# Patient Record
Sex: Female | Born: 1985 | Race: Black or African American | Hispanic: No | Marital: Single | Smoking: Current every day smoker
Health system: Southern US, Community
[De-identification: ages and names within clinical notes are randomized; demographics above are authoritative.]

## PROBLEM LIST (undated history)

## (undated) HISTORY — PX: DENTAL SURGERY: SHX609

---

## 2006-04-13 ENCOUNTER — Emergency Department (HOSPITAL_COMMUNITY): Admission: EM | Admit: 2006-04-13 | Discharge: 2006-04-13 | Payer: Self-pay | Admitting: Emergency Medicine

## 2006-04-27 ENCOUNTER — Ambulatory Visit: Payer: Self-pay | Admitting: Obstetrics and Gynecology

## 2006-04-27 ENCOUNTER — Ambulatory Visit: Payer: Self-pay | Admitting: Family Medicine

## 2006-04-27 ENCOUNTER — Inpatient Hospital Stay (HOSPITAL_COMMUNITY): Admission: EM | Admit: 2006-04-27 | Discharge: 2006-04-30 | Payer: Self-pay | Admitting: Emergency Medicine

## 2006-05-05 ENCOUNTER — Ambulatory Visit: Payer: Self-pay | Admitting: Obstetrics and Gynecology

## 2006-05-31 ENCOUNTER — Ambulatory Visit (HOSPITAL_COMMUNITY): Admission: RE | Admit: 2006-05-31 | Discharge: 2006-05-31 | Payer: Self-pay | Admitting: Obstetrics and Gynecology

## 2008-04-10 IMAGING — CT CT PELVIS W/ CM
2 of 5 series · 16 of 46 positions shown, 18 images · IV contrast (APPLIED)
Comparison: none

CLINICAL DATA: Evaluate free fluid identified on ultrasound.   Patient has 2-week history of headaches, nausea, vomiting, fevers, dizziness.  Heavy menstrual bleeding.
ABDOMEN CT WITH CONTRAST:
TECHNIQUE: Multidetector CT imaging of the abdomen was performed following the standard protocol during bolus administration of intravenous contrast.
Contrast:  80 cc Omnipaque 300.
TECHNIQUE: Multidetector CT imaging of the pelvis was performed following the standard protocol during bolus administration of intravenous contrast.

[Series 2: abd/pelv with 5.0 b31f st · axial · 0.75mm/px · z∈[-398,+2]mm · 13 of 92 slices shown, 15 images]
[im 6/92  soft-tissue]
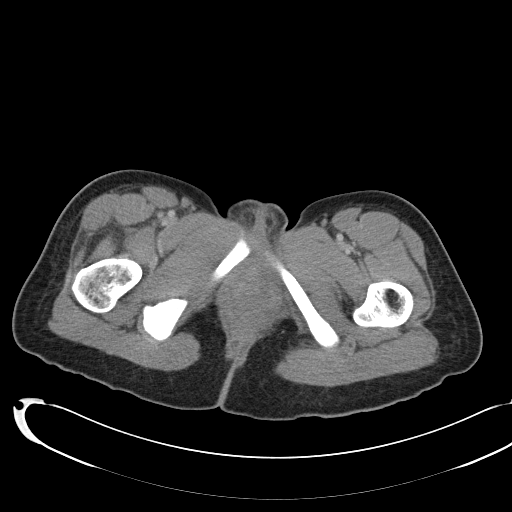
[im 6/92  bone]
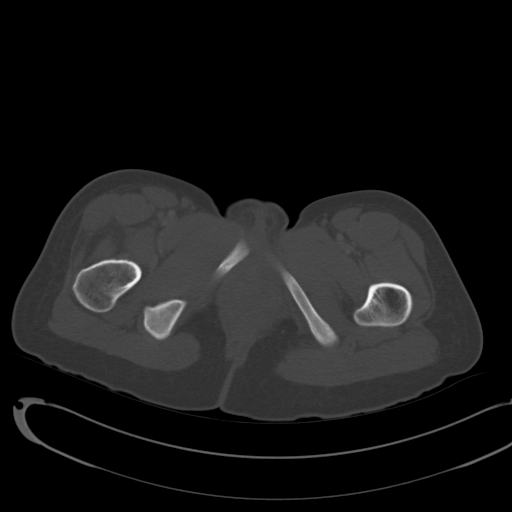
[im 11/92  soft-tissue]
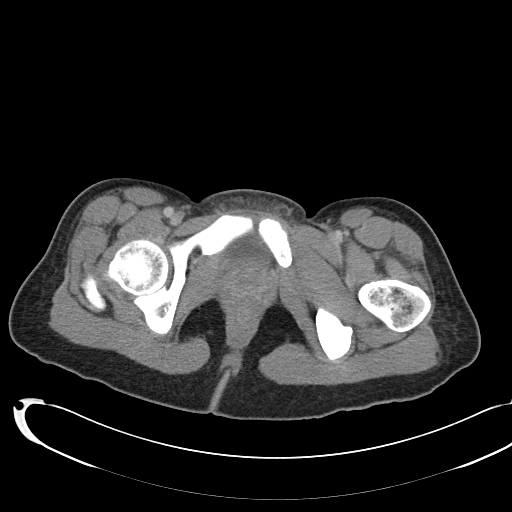
[im 21/92  soft-tissue]
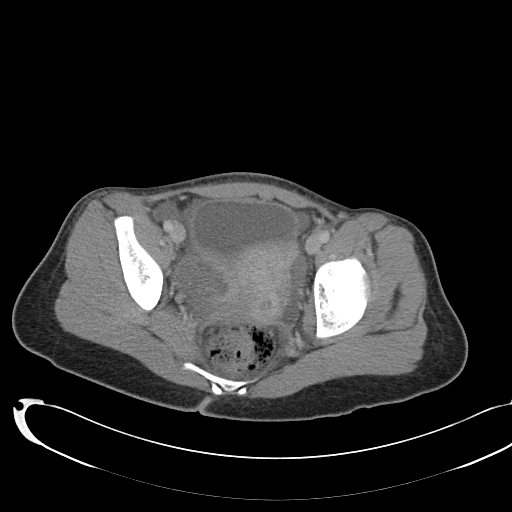
[im 26/92  soft-tissue]
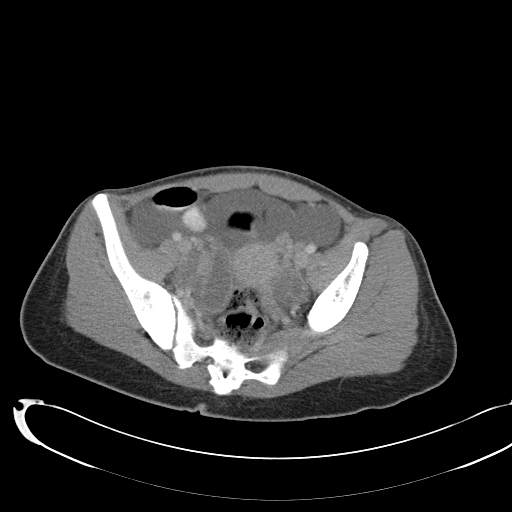
[im 31/92  soft-tissue]
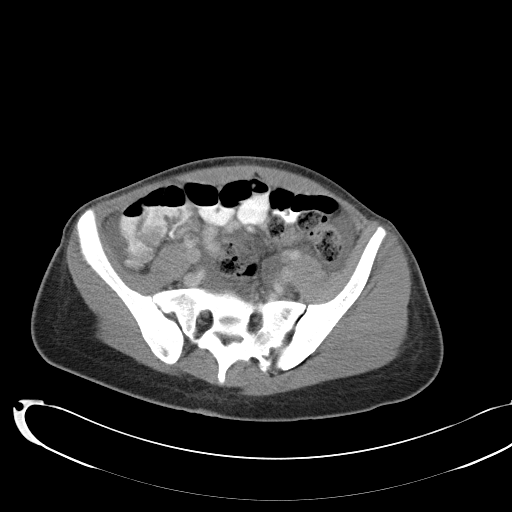
[im 41/92  soft-tissue]
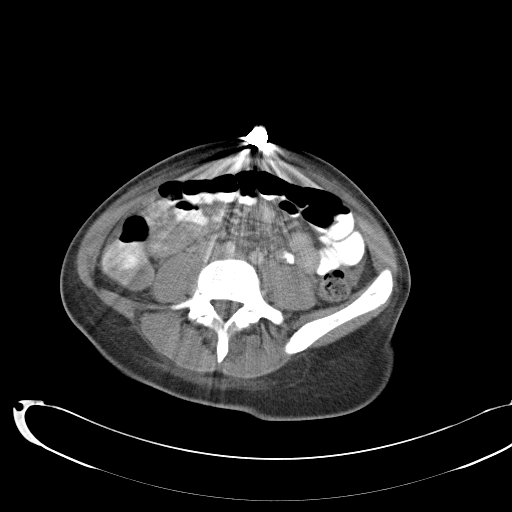
[im 46/92  soft-tissue]
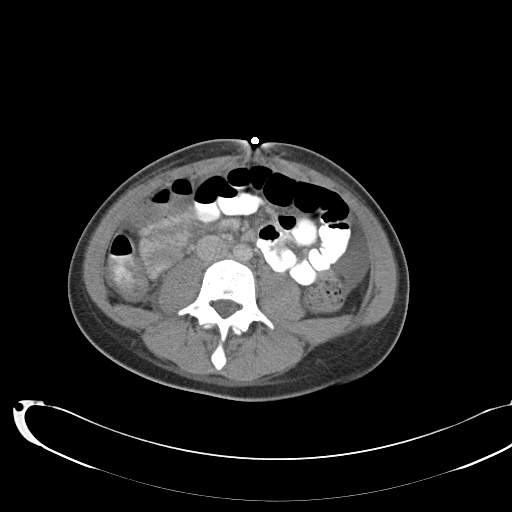
[im 51/92  soft-tissue]
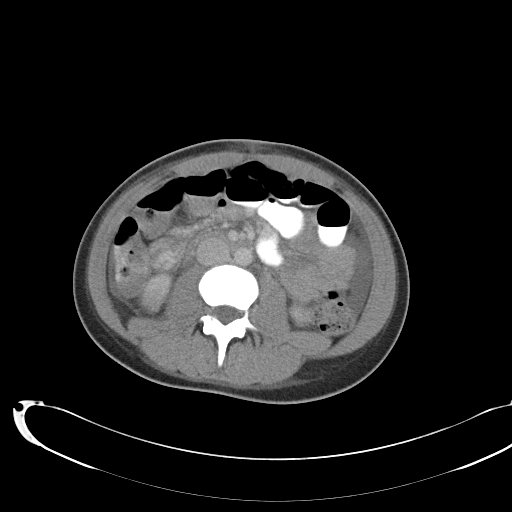
[im 61/92  soft-tissue]
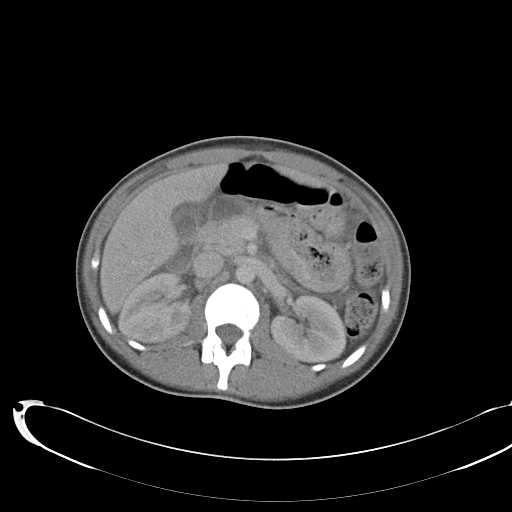
[im 61/92  bone]
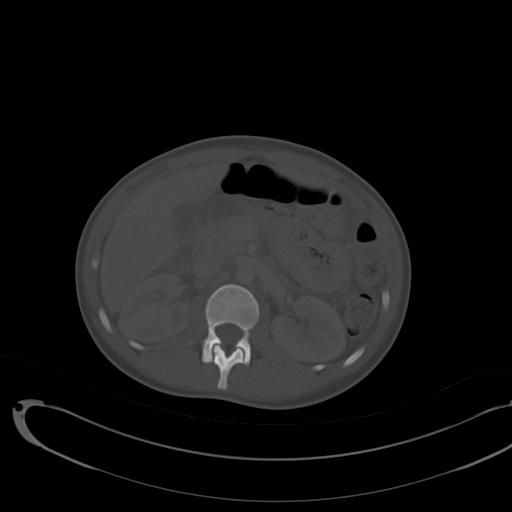
[im 66/92  soft-tissue]
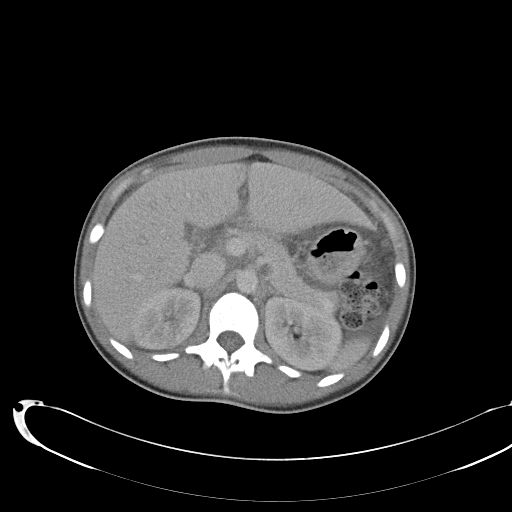
[im 71/92  soft-tissue]
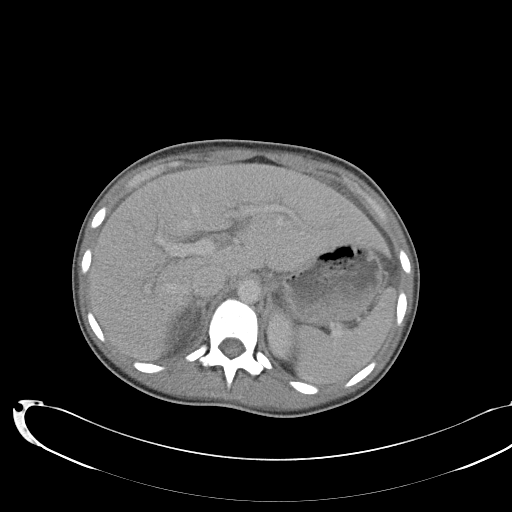
[im 81/92  soft-tissue]
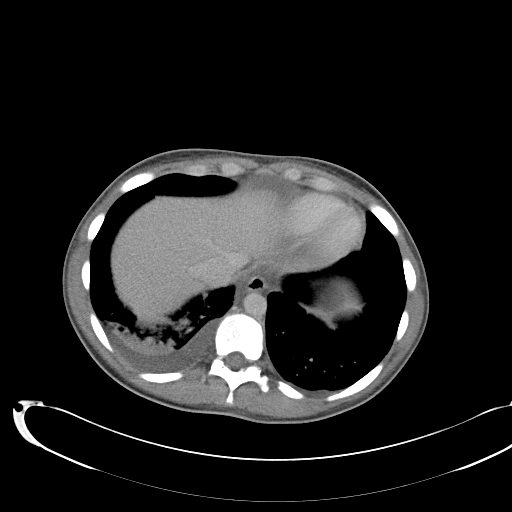
[im 86/92  soft-tissue]
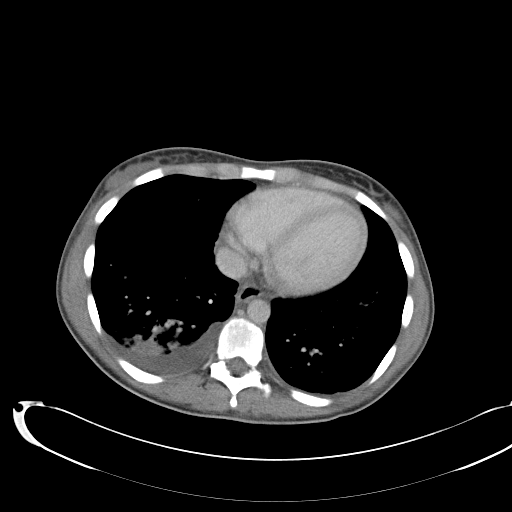

[Series 602: coronal · coronal · 0.89mm/px · 3 of 98 slices shown]
[im 33/98  soft-tissue]
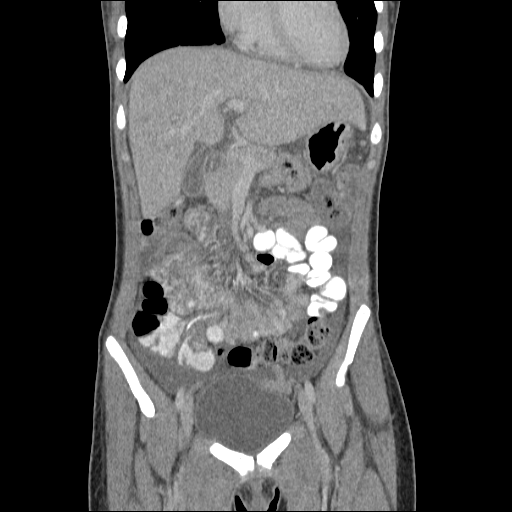
[im 44/98  soft-tissue]
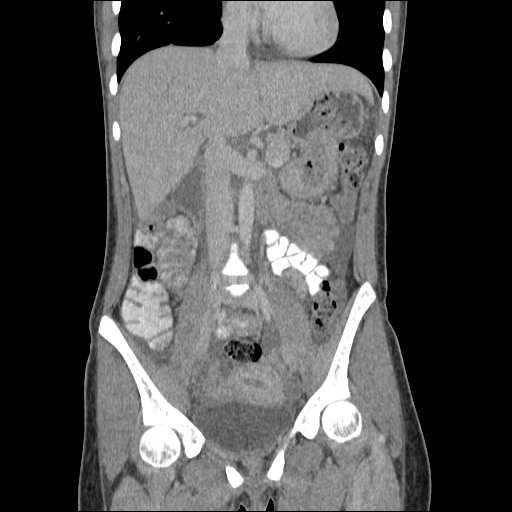
[im 54/98  soft-tissue]
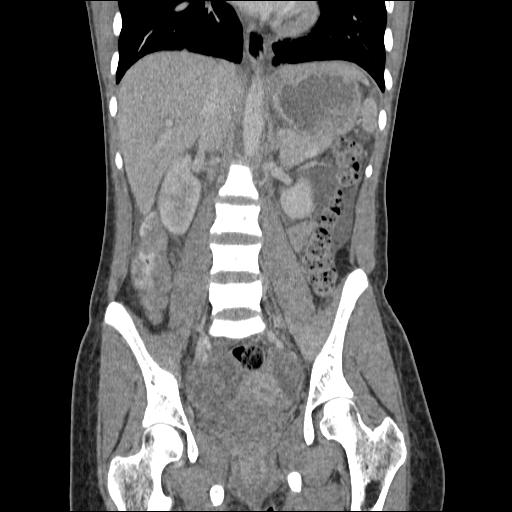

[16 of 46 positions shown; findings below may reference images not displayed]

FINDINGS: There is a right pleural effusion.  Overlying air space disease is noted.  There is mild atelectasis at the left base.  
There is mild periportal edema.  The liver is otherwise normal in attenuation and morphology.  The spleen is negative.  The adrenal gland is negative.   The pancreas is negative.  There is pericholecystic fluid.  Mild enhancement of the gallbladder wall is noted.  This is nonspecific in the setting of ascites.  
The adrenal glands are negative.  The right kidney is negative.  The left kidney is negative.
A moderate amount of free fluid is seen throughout the abdomen and pelvis.  There is no upper abdominal fluid collections identified.  The bowel loops are nonobstructive.  There is contrast material seen within the proximal right colon.
Retroperitoneal adenopathy is noted.  There is no small bowel mesenteric adenopathy.  
Review of the bone windows shows no suspicious lytic or sclerotic lesion.
IMPRESSION: Moderate amount of free fluid in abdomen and pelvis.  In the setting of fevers, nausea and vomiting, findings may be infectious or inflammatory in etiology.  Possible considerations include bowel perforation although no free air is noted.  A ruptured appendicitis may have a similar appearance.  Alternatively, the patient may have a ruptured ovarian cyst.  
PELVIS CT WITH CONTRAST:
FINDINGS: Urinary bladder is negative.  There is fluid within the central uterine canal.  In the right adnexa, there is a tubular fluid density mass.  There is peripheral enhancement.  A moderate amount of free fluid is seen within the pelvis.
IMPRESSION: 1.  Fluid density mass, which assumes a tubular configuration identified in the right adnexa.  The differential diagnosis includes tuboovarian abscess, ruptured ovarian cyst or inflamed appendix, possibly ruptured.  An ectopic pregnancy is less favored in the setting of a negative urine pregnancy test.

## 2010-10-03 NOTE — Discharge Summary (Signed)
Amber [name]              ACCOUNT NO.:  [id number]   MEDICAL RECORD NO.:  [id number]          PATIENT TYPE:  INP   LOCATION:  6705                         FACILITY:  [facility]   PHYSICIAN:  Leighton Roach McDiarmid, M.D.DATE OF BIRTH:  [birth year]   DATE OF ADMISSION:  04/27/2006  DATE OF DISCHARGE:  04/30/2006                               DISCHARGE SUMMARY   CONSULTATIONS:  1. Lesly Dukes, M.D.  2. Phil D. Okey Dupre, M.D. of gynecology.   REASON FOR ADMISSION:  Evaluation and treatment of fevers, nausea, and  chills x2 weeks.   HISTORY OF PRESENT ILLNESS:  In brief, Amber [name] is a 26 year old  female with a history of dysfunction uterine bleeding who presented with  a 2-week history of headache, nausea and vomiting, and fevers with a T-  max of 104.  She also had been bleeding heavily for the past 3 weeks.   DISCHARGE DIAGNOSES:  1. Urinary tract infection.  2. Pneumonia.  3. Severe anemia.  4. Dysfunction uterine bleeding.  5. Bacterial vaginosis.  6. Possible tubo-ovarian abscess.   DISCHARGE MEDICATIONS:  1. Ferrous sulfate 325 mg, one tab daily.  2. Ortho-Novum 1/35, one tab p.o. daily x4 days.  3. Levaquin 750 mg, one tab p.o. daily x5 days.   DISCHARGE LABORATORY:  White count of 13.5, hemoglobin of 10.4,  hematocrit of 32, platelets 295.  GC/Chlamydia negative.  A UA showed E.  coli sensitive to Levaquin.  Rapid strep positive.  TSH 1.083.  PTT 36,  PT of 15.2, INR of 1.2.  Blood cultures negative state.  Ferritin 11.   PENDING LABORATORY:  HIV, RPR, and von Willebrand's.   HOSPITAL COURSE:  1. Infectious disease.  The patient presented with a temperature in      the emergency department of 102.2 with a pulse of 142 with a blood      pressure of 99/69, and a heart rate of 20.  She was sating 100% on      room air.  Furthermore her hemoglobin in the emergency department      was 3.1.  The patient was in shock, therefore, and we immediately      admitted her  and began an immediate blood transfusion and IV fluid      hydration.  We also got a chest x-ray which was negative, a UA,      urine culture, blood culture, and GC/Chlamydia to rule out any      other possible source of infection.  Her urine grew out E. coli      which was sensitive to Levaquin.  Her blood cultures were negative.      Her GC/Chlamydia were also negative.  She also had no adnexal      tenderness or cervical motion tenderness on pelvic exam.  We also      got a vaginal ultrasound to rule out endometriosis or any retained      products or possible tubo-ovarian abscess.  Her transvaginal      ultrasound showed a large amount of free fluid.  It also showed a  dominant cyst in the right ovary.  We therefore got a CT of the      abdomen and pelvis to further evaluate the free fluid in the      abdomen which showed a free fluid density mass which assumed a      tubular configuration in the right adnexa.  We could not rule out      tubo-ovarian abscess or ruptured ovarian cyst or an inflamed      appendix.  It was believed an ectopic pregnancy was negative since      her HCG was negative.  Her HCG quant was also less than 2.  We      therefore consulted gynecology who were on board throughout her      admission.  We went ahead and began treating her as if she did have      a possible tubo-ovarian abscess, pneumonia, and UTI.  We covered      her with broad spectrum gentamicin and clindamycin.  The patient      immediately started to feel better, although on the third day of      her admission, she had a fever spike overnight into the 101.2      range.  She also started to complain about belly pain at that time      which she previously did not have a tender abdomen.  We performed      serial abdominal exams and that abdominal pain eventually resolved.      We will plan to send her home on Levaquin 750 mg to cover possible      pneumonia since she did have a pleural effusion  and some      atelectasis seen on her CT, her UTI, and possible tubo-ovarian      abscess.  2. Dysfunctional uterine bleeding.  She immediately received Premarin      25 mg IV in the emergency department to help stop the bleeding.  We      also started her on Ortho-Novum 1/35 b.i.d. x7 days.  At discharge      she has 4 days remaining on this course.  She also received 4 units      of packed red blood cells and one infusion of Venofer.  We will      send her home with an iron supplement.  On the day of her discharge      she had no complaints.  Her bleeding had stopped, and her      hemoglobin had increased to 10.4.  The rest of her vitals were also      stable at discharge.  Her blood pressure being 89 to 118 over 64 to      77, a respiratory rate of 20, and pulse of 80 to 89 and her      temperature T-max of 99.3.  3. Bacterial vaginosis.  This is incidentally found during the      patient's pelvic exam.  We started treating her with Flagyl.  Once      we changed over to broad spectrum antibiotics, she was also      adequately covered for bacterial vaginosis.  We will not continue      to treat her at this time.  4. Severe anemia secondary to dysfunctional uterine bleeding and      menometrorrhagia.  As previously stated, we will be treating her      with Ortho-Novum for a 7-day  course.  She will also follow up with      the gynecology clinic on May 05, 2006 at 1:45 p.m.     ______________________________  Amber [name], M.D.    ______________________________  Leighton Roach McDiarmid, M.D.    TA/MEDQ  D:  04/30/2006  T:  04/30/2006  Job:  [id number]

## 2010-10-03 NOTE — Group Therapy Note (Signed)
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 Also at  that time, had a severe pyelonephritis, which was treated and placed on  oral contraceptives for dysfunctional bleeding control. She is  completely asymptomatic now. She had a CT that showed a possible pelvic  abscess.   On examination now, the abdomen was soft, flat and nontender. No masses.  No organomegaly.   I am going to have her return in a month, stay on the iron and will  repeat her H&H when she comes in at that time. She is to repeat her  ultrasound in a month and we will get a followup on that and she is to  stay on oral contraceptives.           ______________________________  Argentina Donovan, MD     PR/MEDQ  D:  05/05/2006  T:  05/05/2006  Job:  [id number]

## 2010-10-03 NOTE — H&P (Signed)
[name], [name] NO.:  [id number]   MEDICAL RECORD NO.:  [id number]          PATIENT TYPE:  INP   LOCATION:  2036                         FACILITY:  [facility]   PHYSICIAN:  Santiago Bumpers. Hensel, M.D.DATE OF BIRTH:  Jan 23, 1986   DATE OF ADMISSION:  04/27/2006  DATE OF DISCHARGE:                              HISTORY & PHYSICAL   TIME SEEN:  Three o'clock p.m.   CHIEF COMPLAINT:  Fevers, nausea, chills x2 weeks.   HPI:  [name] is a 25 year old female with no known medical  problems other than dysfunctional uterine bleeding with menometrorrhagia  who presents with a 2 week history of headache, nausea, vomiting and  subjective fevers with a T-Max of 104.  She also complains of dizziness  and heavy menstrual bleeding.  She reports that the headache is a  bilateral frontal headache and is aggravated by sitting or standing and  alleviated by lying down.  She has only vomited 4 times in this 2 week  period, but has been consistently nauseated and has not been eating or  drinking.  She endorses a 15 pound weight loss over the past 2 weeks.  She denies any dysuria, hematuria or flank pain.  She also denies any  change in her bowel habits or sick contacts.  She started menstruating  at 25 years of age and has frequently had a regular cycle, which last  approximately 7 days with heavy flow, but never like this episode.   PAST MEDICAL HISTORY:  Ovarian cyst x1.   GYN HISTORY:  She had 1 elective abortion.  No history of STDs.   PAST SURGICAL HISTORY:  None.   MEDICATIONS:  None.   ALLERGIES:  NO KNOWN DRUG ALLERGIES.   FAMILY HISTORY:  She is adoptive.  She does not know her family history.   SOCIAL HISTORY:  She lives with her boyfriend of 3 months.  She does not  work.  She denies any alcohol or drug use.  She stopped smoking  approximately 3 weeks ago.   LABS ON ADMISSION:  Her white count is 21.9, her hemoglobin is 3.1,  hematocrit is 10.4, platelets of  406, PMNs are 84%.  MCV is 61, RDW of  2.  Sodium of 131, potassium of 3.4, BUN of 10, creatinine of 0.9,  glucose of 120, protein 7.2, albumin 3.3, AST 27, ALT 13.  UA shows  leukocyte esterase, moderate positive protein of 30, hemoglobin large,  no ketones, 21-58 white blood cells, 11-28 red blood cells, few  bacteria, few squames.  Rapid strep culture positive.  Blood culture is  pending.  Urine culture is pending.  Urine pregnancy test negative.  Chest x-ray shows no acute cardiopulmonary disease.   REVIEW OF SYSTEMS:  GENERAL:  She endorses positive for fever.  Positive  for chills.  Positive for malaise.  Positive for anorexia.  HEENT:  No  ear pain.  No visual changes.  RESPIRATORY:  No cough.  No shortness of  breath.  ABDOMEN:  No tenderness.  No dysuria.  No cough.  GU:  No  dysuria.  No hematuria.  No pain with intercourse.   PHYSICAL EXAMINATION:  VITAL SIGNS:  T-Max 102.2.  Pulse of 142.  Blood  pressure 99/69.  Respiratory rate of 20.  Pulse oximetry 100% on room  air.  IN GENERAL:  Patient is pale.  She is alert.  She is oriented x3.  HEENT:  Positive erythema with no pharyngeal exudate.  No  lymphadenopathy.  RESPIRATIONS:  Clear to auscultation bilaterally.  Good effort.  CVS:  Tachycardic.  Regular rhythm.  No murmurs, rubs or gallop.  ABDOMEN:  Soft, nontender.  No fundal tenderness.  Positive bowel  sounds.  No guarding.  MUSCULOSKELETAL:  No flank tenderness.  EXTREMITIES:  Two plus pulses.  No edema.  No clubbing.  PELVIC EXAM:  Shows perfuse blood in the vagina and around cervix.  Difficult to completely visualize cervix due to the amount of blood.  No  adnexal tenderness.   ASSESSMENT/PLAN:  She is a 25 year old female with fevers, nausea,  headaches, dizziness for 2 weeks.   1. Infectious disease.  Likely secondary urinary infectious source      since urinalysis shows moderate leukocyte esterase, positive      nitrites and increased white blood cells.   Patient has also been      febrile with an increased white count.  Chest x-ray is negative.      We will admit and rule out for other possible infectious      etiologies.  Urine and blood cultures pending.  Rapid strep      positive.  We will start antibiotic coverage with Zosyn.  We will      also check GC and Chlamydia.  Also check a vaginal ultrasound to      rule out endometriosis or retained products or ovarian abscess.      Tylenol for fever.  Phenergan for nausea.  2. Anemia.  Likely secondary with dysfunction bleeding with      menometrorrhagia.  We will type and cross 4 units of packed red      blood cells, transfuse now.  Patient is clinically in shock as her      hemoglobin is 3.1, pulse of 142, blood pressure of 99/69.  We will      begin IV fluid hydration.  One liter of bolus normal saline x2 in      the ED now.  Urine pregnancy test is negative.  We will check serum      folate and receive fluid.  We also checked a hemoccult.  We will      continue to monitor her H&H.  We will Premarin 25 IV x1 now.  We      will plan to recheck her clinically in 6 hours.  If she is still      bleeding, we will give another 25 mg of Premarin IV x1 now.  We      also checked her PT and PTT.  3. Tachycardia.  Likely secondary to hypovolemia and dehydration.  We      will check orthostatics, check an EKG.  Rehydrate with IV fluids      and begin transfusing blood now.  4. Menometrorrhagia with dysfunction uterine bleeding.  Give Premarin      25 mg IV x1 now, then plan to give Jodi Mourning 1/35 one tablet p.o.      t.i.d. starting tomorrow at 8 a.m.  May also need to give another      of Premarin over night.  Patient  reports having irregular periods      for years.  We will plan a pelvic ultrasound once the bleeding      subsides.  5. Nausea and vomiting.  Likely secondary to infectious etiology and     hypervolemia from ABL.  Phenergan for nausea.  6. FEN/GI.  Regular diet as tolerated.  7.  Disposition.  We will admit to step down unit, transfuse and      monitor her vitals.     ______________________________  Ruthe Mannan, M.D.    ______________________________  Santiago Bumpers. Leveda Anna, M.D.    TA/MEDQ  D:  04/27/2006  T:  04/28/2006  Job:  [id number]

## 2015-11-05 ENCOUNTER — Encounter (HOSPITAL_COMMUNITY): Payer: Self-pay | Admitting: *Deleted

## 2015-11-05 ENCOUNTER — Emergency Department (HOSPITAL_COMMUNITY)
Admission: EM | Admit: 2015-11-05 | Discharge: 2015-11-05 | Disposition: A | Discharge status: Home / Self Care | Payer: Self-pay | Attending: Emergency Medicine | Admitting: Emergency Medicine

## 2015-11-05 DIAGNOSIS — F17210 Nicotine dependence, cigarettes, uncomplicated: Secondary | ICD-10-CM | POA: Insufficient documentation

## 2015-11-05 DIAGNOSIS — K0889 Other specified disorders of teeth and supporting structures: Secondary | ICD-10-CM | POA: Insufficient documentation

## 2015-11-05 DIAGNOSIS — F1290 Cannabis use, unspecified, uncomplicated: Secondary | ICD-10-CM | POA: Insufficient documentation

## 2015-11-05 MED ORDER — AMOXICILLIN 500 MG PO CAPS
500.0000 mg | ORAL_CAPSULE | Freq: Once | ORAL | Status: AC
  Administered 2015-11-05: 500 mg via ORAL
  Filled 2015-11-05: qty 1

## 2015-11-05 MED ORDER — IBUPROFEN 800 MG PO TABS: 800.0000 mg | ORAL_TABLET | Freq: Three times a day (TID) | ORAL | Status: AC

## 2015-11-05 MED ORDER — AMOXICILLIN 500 MG PO CAPS: 500.0000 mg | ORAL_CAPSULE | Freq: Three times a day (TID) | ORAL | Status: AC

## 2015-11-05 MED ORDER — IBUPROFEN 800 MG PO TABS
800.0000 mg | ORAL_TABLET | Freq: Once | ORAL | Status: AC
  Administered 2015-11-05: 800 mg via ORAL
  Filled 2015-11-05: qty 1

## 2015-11-05 NOTE — ED Notes (Signed)
Pt reports L upper dental pain x 2 days. Has taken one of her Father's hydrocodone without relief.  Pt reports having a permanent L upper tooth implant, her dentist is in [place].

## 2015-11-05 NOTE — ED Provider Notes (Signed)
CSN: [id number]     Arrival date & time 11/05/15  1251 History  By signing my name below, I, Soijett Blue, attest that this documentation has been prepared under the direction and in the presence of Wynetta EmeryNicole Bryden Darden, PA-C Electronically Signed: Soijett Blue, ED Scribe. 11/05/2015. 2:22 PM.   Chief Complaint  Patient presents with  . Dental Pain      The history is provided by the patient. No language interpreter was used.    Brock BadGenda Saintjean is a 30 y.o. female who presents to the Emergency Department complaining of left upper dental pain onset 2 days. Pt has a dentist in [place] and she will follow up whenever she goes back to [place]. Pt reports that she has a permanent metal plate to her left upper teeth area and she denies recent injury or trauma. She states that she is having associated symptoms of left upper gum swelling. She states that she has tried her fathers 325 mg hydrocodone with no relief for her symptoms. She denies fever, chills, any other symptoms. Pt denies allergies to any medications.    History reviewed. No pertinent past medical history. Past Surgical History  Procedure Laterality Date  . Dental surgery     No family history on file. Social History  Substance Use Topics  . Smoking status: Current Every Day Smoker    Types: Cigars  . Smokeless tobacco: None  . Alcohol Use: Yes     Comment: weekly   OB History    No data available     Review of Systems  A complete 10 system review of systems was obtained and all systems are negative except as noted in the HPI and PMH.   Allergies  Review of patient's allergies indicates no known allergies.  Home Medications   Prior to Admission medications   Not on File   BP 151/104 mmHg  Pulse 97  Temp(Src) 99.3 F (37.4 C) (Oral)  Resp 16  SpO2 100%  LMP 10/15/2015 Physical Exam  Constitutional: She is oriented to person, place, and time. She appears well-developed and well-nourished. No distress.  HENT:   Head: Normocephalic and atraumatic.  Mouth/Throat: Uvula is midline and oropharynx is clear and moist. No trismus in the jaw. No uvula swelling. No oropharyngeal exudate, posterior oropharyngeal edema, posterior oropharyngeal erythema or tonsillar abscesses.  Generally good dentition, no gingival swelling, erythema or tenderness to palpation. Patient is handling their secretions. There is no tenderness to palpation or firmness underneath tongue bilaterally. No trismus.    Eyes: Conjunctivae and EOM are normal.  Neck: Neck supple.  Cardiovascular: Normal rate.   Pulmonary/Chest: Effort normal. No stridor. No respiratory distress.  Abdominal: She exhibits no distension.  Musculoskeletal: Normal range of motion.  Lymphadenopathy:    She has no cervical adenopathy.  Neurological: She is alert and oriented to person, place, and time.  Skin: Skin is warm and dry.  Psychiatric: She has a normal mood and affect. Her behavior is normal.  Nursing note and vitals reviewed.   ED Course  Procedures (including critical care time) DIAGNOSTIC STUDIES: Oxygen Saturation is 100% on RA, nl by my interpretation.    COORDINATION OF CARE: 2:19 PM Discussed treatment plan with pt at bedside which includes referral and follow up with dentist, ibuprofen, abx Rx and pt agreed to plan.    Labs Review Labs Reviewed - No data to display  Imaging Review No results found.    EKG Interpretation None      MDM  Final diagnoses:  None   Filed Vitals:   11/05/15 1311 11/05/15 1429  BP: 151/104   Pulse: 97 92  Temp: 99.3 F (37.4 C)   TempSrc: Oral   Resp: 16   SpO2: 100% 100%    Medications  ibuprofen (ADVIL,MOTRIN) tablet 800 mg (800 mg Oral Given 11/05/15 1429)  amoxicillin (AMOXIL) capsule 500 mg (500 mg Oral Given 11/05/15 1429)    Novelle Fanfan is 30 y.o. female presenting with Dental pain. She's had a history of reconstructive surgery after a fall several years ago in [place]. No  overt signs of infection. Patient will be started on amoxicillin, unfortunately this patient did not receive any relief after taking hydrocodone, I've offered her a dental block and she is refused, will start her on ibuprofen and advise very close follow-up with her dentist.  Evaluation does not show pathology that would require ongoing emergent intervention or inpatient treatment. Pt is hemodynamically stable and mentating appropriately. Discussed findings and plan with patient/guardian, who agrees with care plan. All questions answered. Return precautions discussed and outpatient follow up given.   Discharge Medication List as of 11/05/2015  2:24 PM    START taking these medications   Details  amoxicillin (AMOXIL) 500 MG capsule Take 1 capsule (500 mg total) by mouth 3 (three) times daily., Starting 11/05/2015, Until Discontinued, Print    ibuprofen (ADVIL,MOTRIN) 800 MG tablet Take 1 tablet (800 mg total) by mouth 3 (three) times daily., Starting 11/05/2015, Until Discontinued, Print         I personally performed the services described in this documentation, which was scribed in my presence. The recorded information has been reviewed and is accurate.    Wynetta Emery, PA-C 11/05/15 1513  Vanetta Mulders, MD 11/06/15 1003

## 2015-11-05 NOTE — Discharge Instructions (Signed)
Take vicodin for breakthrough pain, do not drink alcohol, drive, care for children or do other critical tasks while taking vicodin. ° °Return to the emergency room for fever, change in vision, redness to the face that rapidly spreads towards the eye, nausea or vomiting, difficulty swallowing or shortness of breath. °  °Apply warm compresses to jaw throughout the day.  ° °Take your antibiotics as directed and to the end of the course. DO NOT drink alcohol when taking metronidazole, it will make you very sick!  ° °Followup with a dentist is very important for ongoing evaluation and management of recurrent dental pain. Return to emergency department for emergent changing or worsening symptoms." ° °Low-cost dental clinic: °**[facility]  [facility]  at [phone]**  ° °You may also call [phone] ° °Dental Assistance °If the dentist on-call cannot see you, please use the resources below: ° ° °Patients with Medicaid: [facility] °[address], [phone] °[address], [phone] ° °If unable to pay, or uninsured, contact [facility] ([phone]) or [facility] ([phone] in [place], [phone] in [place]) to become qualified for the adult dental clinic ° °Other Low-Cost Community Dental Services: °[facility]- [address], [place], [place], [address] °   [phone], Ext. [phone] °   2nd and 4th Thursday of the month at 6:30am °   10 clients each day by appointment, can sometimes see walk-in     patients if someone does not show for an appointment °[facility]- [address], [place], [place], [address] °   [phone] °[facility]- [address], [place], [place], [address] °   [phone] ° °[facility]- [phone] °[facility]- [phone] °[facility]- [phone] ° °

## 2022-06-18 ENCOUNTER — Encounter (HOSPITAL_BASED_OUTPATIENT_CLINIC_OR_DEPARTMENT_OTHER): Payer: Self-pay

## 2022-06-18 ENCOUNTER — Emergency Department (HOSPITAL_BASED_OUTPATIENT_CLINIC_OR_DEPARTMENT_OTHER)
Admission: EM | Admit: 2022-06-18 | Discharge: 2022-06-18 | Disposition: A | Discharge status: Home / Self Care | Payer: Self-pay | Attending: Emergency Medicine | Admitting: Emergency Medicine

## 2022-06-18 ENCOUNTER — Other Ambulatory Visit: Payer: Self-pay

## 2022-06-18 DIAGNOSIS — L539 Erythematous condition, unspecified: Secondary | ICD-10-CM | POA: Insufficient documentation

## 2022-06-18 DIAGNOSIS — J020 Streptococcal pharyngitis: Secondary | ICD-10-CM | POA: Insufficient documentation

## 2022-06-18 LAB — GROUP A STREP BY PCR: Group A Strep by PCR: DETECTED — AB

## 2022-06-18 MED ORDER — AMOXICILLIN 500 MG PO CAPS: 500.0000 mg | ORAL_CAPSULE | Freq: Two times a day (BID) | ORAL | 0 refills | Status: AC

## 2022-06-18 NOTE — Discharge Instructions (Signed)
It was a pleasure taking care of you today.  Your strep test was positive.  I am sending you home with antibiotics.  Take as prescribed and finish all antibiotics.  Return to the ER for new or worsening symptoms.

## 2022-06-18 NOTE — ED Provider Notes (Signed)
EMERGENCY DEPARTMENT AT [facility] Provider Note   CSN: [id number] Arrival date & time: 06/18/22  1330     History  Chief Complaint  Patient presents with   Sore Throat    Amber [name] is a 37 y.o. female sore throat x 2 days.  Denies fever.  No difficulty swallowing.  Denies shortness of breath and chest pain.  No sick contacts. Believes she has strep throat. History of same.   History obtained from patient and past medical records. No interpreter used during encounter.       Home Medications Prior to Admission medications   Medication Sig Start Date End Date Taking? Authorizing Provider  amoxicillin (AMOXIL) 500 MG capsule Take 1 capsule (500 mg total) by mouth 2 (two) times daily for 10 days. 06/18/22 06/28/22 Yes Dahir Ayer, Druscilla Brownie, PA-C  amoxicillin (AMOXIL) 500 MG capsule Take 1 capsule (500 mg total) by mouth 3 (three) times daily. 11/05/15   Pisciotta, Elmyra Ricks, PA-C  ibuprofen (ADVIL,MOTRIN) 800 MG tablet Take 1 tablet (800 mg total) by mouth 3 (three) times daily. 11/05/15   Pisciotta, Elmyra Ricks, PA-C      Allergies    Patient has no known allergies.    Review of Systems   Review of Systems  Constitutional:  Negative for fever.  HENT:  Positive for sore throat. Negative for trouble swallowing and voice change.   All other systems reviewed and are negative.   Physical Exam Updated Vital Signs BP (!) 150/85 (BP Location: Left Arm)   Pulse 84   Temp 98.9 F (37.2 C) (Oral)   Resp 18   Ht 5\' 2"  (1.575 m)   Wt 61.2 kg   LMP 06/11/2022 (Approximate)   SpO2 100%   BMI 24.69 kg/m  Physical Exam Vitals and nursing note reviewed.  Constitutional:      General: She is not in acute distress.    Appearance: She is not ill-appearing.  HENT:     Head: Normocephalic.     Mouth/Throat:     Pharynx: Oropharyngeal exudate and posterior oropharyngeal erythema present.  Eyes:     Pupils: Pupils are equal, round, and reactive to light.   Cardiovascular:     Rate and Rhythm: Normal rate and regular rhythm.     Pulses: Normal pulses.     Heart sounds: Normal heart sounds. No murmur heard.    No friction rub. No gallop.  Pulmonary:     Effort: Pulmonary effort is normal.     Breath sounds: Normal breath sounds.  Abdominal:     General: Abdomen is flat. There is no distension.     Palpations: Abdomen is soft.     Tenderness: There is no abdominal tenderness. There is no guarding or rebound.  Musculoskeletal:        General: Normal range of motion.     Cervical back: Neck supple.  Skin:    General: Skin is warm and dry.  Neurological:     General: No focal deficit present.     Mental Status: She is alert.  Psychiatric:        Mood and Affect: Mood normal.        Behavior: Behavior normal.     ED Results / Procedures / Treatments   Labs (all labs ordered are listed, but only abnormal results are displayed) Labs Reviewed  GROUP A STREP BY PCR - Abnormal; Notable for the following components:      Result Value   Group A  Strep by PCR DETECTED (*)    All other components within normal limits    EKG None  Radiology No results found.  Procedures Procedures    Medications Ordered in ED Medications - No data to display  ED Course/ Medical Decision Making/ A&P                             Medical Decision Making  37 year old female presents to the ED due to sore throat x 2 days.  Denies difficulty swallowing, changes to phonation, trismus.  Upon arrival, stable vitals.  Patient in no acute distress.  Physical exam significant for 2+ tonsillar hypertrophy bilaterally with bilateral exudates.  Uvula midline.  No abscess.  Patient tolerating oral secretions without difficulty.  Normal phonation.  Strep positive.  Patient declined steroids for tonsillar hypertrophy. Patent airway. No signs of respiratory distress. Patient discharged with amoxicillin. Strict ED precautions discussed with patient. Patient states  understanding and agrees to plan. Patient discharged home in no acute distress and stable vitals  No PCP       Final Clinical Impression(s) / ED Diagnoses Final diagnoses:  Strep throat    Rx / DC Orders ED Discharge Orders          Ordered    amoxicillin (AMOXIL) 500 MG capsule  2 times daily        06/18/22 1623              Karie Kirks 06/18/22 1623    Fredia Sorrow, MD 06/20/22 2006

## 2022-06-18 NOTE — ED Notes (Signed)
Refusing respiratory panel during triage.

## 2022-06-18 NOTE — ED Triage Notes (Signed)
Pt c/o sore throat since Tuesday. Denies fever.
